# Patient Record
Sex: Male | Born: 2005 | Race: White | Hispanic: No | Marital: Single | Smoking: Never smoker
Health system: Southern US, Community
[De-identification: ages and names within clinical notes are randomized; demographics above are authoritative.]

## PROBLEM LIST (undated history)

## (undated) DIAGNOSIS — F419 Anxiety disorder, unspecified: Secondary | ICD-10-CM

## (undated) DIAGNOSIS — F329 Major depressive disorder, single episode, unspecified: Secondary | ICD-10-CM

## (undated) DIAGNOSIS — F32A Depression, unspecified: Secondary | ICD-10-CM

## (undated) HISTORY — PX: ADENOIDECTOMY: SHX5191

## (undated) HISTORY — PX: OTHER SURGICAL HISTORY: SHX169

---
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
## 2023-01-25 ENCOUNTER — Ambulatory Visit (INDEPENDENT_AMBULATORY_CARE_PROVIDER_SITE_OTHER): Admitting: Family

## 2023-01-25 ENCOUNTER — Encounter: Payer: Self-pay | Admitting: Family

## 2023-01-25 VITALS — BP 101/60 | HR 71 | Temp 97.9°F | Ht 69.0 in | Wt 115.0 lb

## 2023-01-25 DIAGNOSIS — F320 Major depressive disorder, single episode, mild: Secondary | ICD-10-CM

## 2023-01-25 DIAGNOSIS — F411 Generalized anxiety disorder: Secondary | ICD-10-CM | POA: Diagnosis not present

## 2023-01-25 MED ORDER — DESVENLAFAXINE SUCCINATE ER 100 MG PO TB24: 100.0000 mg | ORAL_TABLET | Freq: Every day | ORAL | 1 refills | Status: DC

## 2023-01-25 MED ORDER — BUSPIRONE HCL 5 MG PO TABS: 5.0000 mg | ORAL_TABLET | Freq: Three times a day (TID) | ORAL | 1 refills | Status: DC | PRN

## 2023-01-25 MED ORDER — DESVENLAFAXINE SUCCINATE ER 50 MG PO TB24: ORAL_TABLET | ORAL | 0 refills | Status: DC

## 2023-01-25 NOTE — Patient Instructions (Signed)

## 2023-01-25 NOTE — Progress Notes (Signed)
Subjective:    Patient ID: Taylor Juarez, male    DOB: 2006-04-02, 17 y.o.   MRN: [id number]  Chief Complaint  Patient presents with   Depression   PT presents to the office today to discuss depression. He reports he has not been on any medications for the last two years.  He has tried Zoloft, Lexapro, Effexor, Pristiq, and Vistaril.  Depression        This is a chronic problem.  The current episode started more than 1 year ago.   Associated symptoms include fatigue, helplessness, hopelessness, irritable, restlessness, decreased interest and sad.  Associated symptoms include no appetite change and no suicidal ideas.  Past medical history includes anxiety.   Anxiety Presents for follow-up visit. Symptoms include compulsions, excessive worry, nervous/anxious behavior, obsessions and restlessness. Patient reports no suicidal ideas. The severity of symptoms is moderate.        Review of Systems  Constitutional:  Positive for fatigue. Negative for appetite change.  Psychiatric/Behavioral:  Positive for depression. Negative for suicidal ideas. The patient is nervous/anxious.   All other systems reviewed and are negative.      Objective:   Physical Exam Vitals reviewed.  Constitutional:      General: He is irritable. He is not in acute distress.    Appearance: He is well-developed.  HENT:     Head: Normocephalic.  Eyes:     General:        Right eye: No discharge.        Left eye: No discharge.     Pupils: Pupils are equal, round, and reactive to light.  Neck:     Thyroid: No thyromegaly.  Cardiovascular:     Rate and Rhythm: Normal rate and regular rhythm.     Heart sounds: Normal heart sounds. No murmur heard. Pulmonary:     Effort: Pulmonary effort is normal. No respiratory distress.     Breath sounds: Normal breath sounds. No wheezing.  Abdominal:     General: Bowel sounds are normal. There is no distension.     Palpations: Abdomen is soft.     Tenderness: There is no  abdominal tenderness.  Musculoskeletal:        General: No tenderness. Normal range of motion.     Cervical back: Normal range of motion and neck supple.  Skin:    General: Skin is warm and dry.     Findings: No erythema or rash.  Neurological:     Mental Status: He is alert and oriented to person, place, and time.     Cranial Nerves: No cranial nerve deficit.     Deep Tendon Reflexes: Reflexes are normal and symmetric.  Psychiatric:        Mood and Affect: Affect is flat.        Behavior: Behavior normal.        Thought Content: Thought content normal.        Judgment: Judgment normal.     BP 101/60   Pulse 71   Temp 97.9 F (36.6 C) (Temporal)   Ht 5\' 9"  (1.753 m)   Wt 115 lb (52.2 kg)   SpO2 97%   BMI 16.98 kg/m       Assessment & Plan:   Taylor Juarez comes in today with chief complaint of Depression   Diagnosis and orders addressed:  1. GAD (generalized anxiety disorder) - desvenlafaxine (PRISTIQ) 50 MG 24 hr tablet; Take 1 tablet (50 mg total) by mouth daily for 14  days, THEN 2 tablets (100 mg total) daily for 14 days.  Dispense: 42 tablet; Refill: 0 - desvenlafaxine (PRISTIQ) 100 MG 24 hr tablet; Take 1 tablet (100 mg total) by mouth daily.  Dispense: 90 tablet; Refill: 1 - busPIRone (BUSPAR) 5 MG tablet; Take 1 tablet (5 mg total) by mouth 3 (three) times daily as needed.  Dispense: 90 tablet; Refill: 1  2. Depression, major, single episode, mild (HCC) - desvenlafaxine (PRISTIQ) 50 MG 24 hr tablet; Take 1 tablet (50 mg total) by mouth daily for 14 days, THEN 2 tablets (100 mg total) daily for 14 days.  Dispense: 42 tablet; Refill: 0 - desvenlafaxine (PRISTIQ) 100 MG 24 hr tablet; Take 1 tablet (100 mg total) by mouth daily.  Dispense: 90 tablet; Refill: 1 - busPIRone (BUSPAR) 5 MG tablet; Take 1 tablet (5 mg total) by mouth 3 (three) times daily as needed.  Dispense: 90 tablet; Refill: 1  Will start Pristiq 50 mg then increase 100 mg after 2 weeks  Buspar 5 mg  TID prn  Stress management  Reviewed gene sight testing  Follow up plan: 6-8 weeks   Taylor Rodney, FNP

## 2023-03-12 ENCOUNTER — Encounter: Payer: Self-pay | Admitting: Family

## 2023-03-12 ENCOUNTER — Ambulatory Visit: Admitting: Family

## 2023-04-17 ENCOUNTER — Encounter: Payer: Self-pay | Admitting: Family Medicine

## 2023-04-17 ENCOUNTER — Ambulatory Visit (INDEPENDENT_AMBULATORY_CARE_PROVIDER_SITE_OTHER): Admitting: Family Medicine

## 2023-04-17 VITALS — BP 123/64 | HR 110 | Temp 98.7°F | Ht 69.0 in | Wt 120.2 lb

## 2023-04-17 DIAGNOSIS — Z23 Encounter for immunization: Secondary | ICD-10-CM

## 2023-04-17 DIAGNOSIS — F320 Major depressive disorder, single episode, mild: Secondary | ICD-10-CM

## 2023-04-17 DIAGNOSIS — F411 Generalized anxiety disorder: Secondary | ICD-10-CM | POA: Diagnosis not present

## 2023-04-17 MED ORDER — DESVENLAFAXINE SUCCINATE ER 100 MG PO TB24: 100.0000 mg | ORAL_TABLET | Freq: Every day | ORAL | 1 refills | Status: DC

## 2023-04-17 NOTE — Progress Notes (Signed)
Subjective: CC:GAD/ depression PCP: Junie Spencer, FNP ZOX:[id number] Taylor Juarez is a 17 y.o. male presenting to clinic today for:  1. GAD/ depression Patient seen in Sept. By PCP, Jannifer Rodney and was started on Pristiq and Buspar.  He was instructed to follow up in Oct. But no showed that visit.  Today he presents for acute visit to discuss GAD.  He notes that he did not find the medication to be helpful.  For some reason the 100 mg that she had sent in was not available to him through the pharmacy.  They apparently "did not get it".  He subsequently has been out of the medication for about 3 weeks now.  Has used BuSpar occasionally but has not had to use it much.  Has not yet established care with a counselor but does have information to do so.   ROS: Per HPI  Allergies  Allergen Reactions   Sulfa Antibiotics Hives   Past Medical History:  Diagnosis Date   Anxiety    Depression     Current Outpatient Medications:    busPIRone (BUSPAR) 5 MG tablet, Take 1 tablet (5 mg total) by mouth 3 (three) times daily as needed., Disp: 90 tablet, Rfl: 1   desvenlafaxine (PRISTIQ) 100 MG 24 hr tablet, Take 1 tablet (100 mg total) by mouth daily., Disp: 90 tablet, Rfl: 1   desvenlafaxine (PRISTIQ) 50 MG 24 hr tablet, Take 1 tablet (50 mg total) by mouth daily for 14 days, THEN 2 tablets (100 mg total) daily for 14 days., Disp: 42 tablet, Rfl: 0 Social History   Socioeconomic History   Marital status: Single    Spouse name: Not on file   Number of children: Not on file   Years of education: Not on file   Highest education level: Not on file  Occupational History   Not on file  Tobacco Use   Smoking status: Never   Smokeless tobacco: Never  Vaping Use   Vaping status: Never Used  Substance and Sexual Activity   Alcohol use: Not Currently   Drug use: Never   Sexual activity: Never  Other Topics Concern   Not on file  Social History Narrative   Not on file   Social Determinants of  Health   Financial Resource Strain: Not on file  Food Insecurity: Not on file  Transportation Needs: Not on file  Physical Activity: Not on file  Stress: Not on file  Social Connections: Not on file  Intimate Partner Violence: Not on file   No family history on file.  Objective: Office vital signs reviewed. BP 123/64   Pulse (!) 110   Temp 98.7 F (37.1 C)   Ht 5\' 9"  (1.753 m)   Wt 120 lb 3.2 oz (54.5 kg)   SpO2 97%   BMI 17.75 kg/m   Physical Examination:  General: Awake, alert, thin, No acute distress HEENT: Sclera white.  Moist mucous membranes Cardio: regular rate and rhythm, S1S2 heard, no murmurs appreciated Pulm: clear to auscultation bilaterally, no wheezes, rhonchi or rales; normal work of breathing on room air Psych: Affect flat.  Mood stable.  Pleasant.     04/17/2023   10:19 AM 01/25/2023    2:49 PM 12/20/2020    2:08 PM  Depression screen PHQ 2/9  Decreased Interest 2 3 1   Down, Depressed, Hopeless 1 2 1   PHQ - 2 Score 3 5 2   Altered sleeping 3 2 1   Tired, decreased energy 3 3 0  Change in appetite 2 3 0  Feeling bad or failure about yourself  1 1 0  Trouble concentrating 2 1 1   Moving slowly or fidgety/restless 0 0 0  Suicidal thoughts 0 0 0  PHQ-9 Score 14 15 4   Difficult doing work/chores  Somewhat difficult Not difficult at all      04/17/2023   10:19 AM 01/25/2023    2:49 PM 12/20/2020    2:08 PM 10/20/2020    3:31 PM  GAD 7 : Generalized Anxiety Score  Nervous, Anxious, on Edge 2 2 0 2  Control/stop worrying 2 1 0 2  Worry too much - different things 1 3 0 1  Trouble relaxing 3 3 0 3  Restless 2 2 0 1  Easily annoyed or irritable 1 1 0 2  Afraid - awful might happen 3 2 1 1   Total GAD 7 Score 14 14 1 12   Anxiety Difficulty  Somewhat difficult Not difficult at all Somewhat difficult    Assessment/ Plan: 17 y.o. male   GAD (generalized anxiety disorder) - Plan: desvenlafaxine (PRISTIQ) 100 MG 24 hr tablet  Depression, major, single  episode, mild (HCC) - Plan: desvenlafaxine (PRISTIQ) 100 MG 24 hr tablet  Encounter for immunization - Plan: Flu vaccine trivalent PF, 6mos and older(Flulaval,Afluria,Fluarix,Fluzone)  Anxiety and depression are not currently controlled but he has been out of the medication for a month now.  I have resent the prescription that his PCP sent off initially.  Encouraged to follow-up with both counseling services and PCP as directed.  Influenza vaccination administered.   Raliegh Ip, DO [facility] [phone]

## 2024-05-28 ENCOUNTER — Ambulatory Visit: Payer: Self-pay

## 2024-05-28 NOTE — Telephone Encounter (Signed)
 Apt scheduled.

## 2024-05-28 NOTE — Telephone Encounter (Signed)
 FYI Only or Action Required?: FYI only for provider: appointment scheduled on 1/12.  Patient was last seen in primary care on 04/17/2023 by Jolinda Norene HERO, DO.  Called Nurse Triage reporting Depression.  Symptoms began several weeks ago.  Interventions attempted: Nothing.  Symptoms are: gradually worsening.  Triage Disposition: See Physician Within 24 Hours  Patient/caregiver understands and will follow disposition?: Yes, will follow disposition  Copied from CRM #[id number]. Topic: Clinical - Red Word Triage >> May 28, 2024 11:04 AM Avram MATSU wrote: Red Word that prompted transfer to Nurse Triage: anxiety/depression getting worse   ----------------------------------------------------------------------- From previous Reason for Contact - Scheduling: Patient/patient representative is calling to schedule an appointment. Refer to attachments for appointment information. Reason for Disposition  [1] Depression AND [2] getting worse (e.g., sleeping poorly, less able to do activities of daily living)  Answer Assessment - Initial Assessment Questions 1. CONCERN: What happened that made you call today?     My anxiety and depression is getting worse, would like to talk to her about my old prescription 2. DEPRESSION SYMPTOM SCREENING: How are you feeling overall? (e.g., decreased energy, increased sleeping or difficulty sleeping, difficulty concentrating, feelings of sadness, guilt, hopelessness, or worthlessness)     Difficult memory, energy inconsistent, restless sleep 3. RISK OF HARM - SUICIDAL IDEATION:  Do you ever have thoughts of hurting or killing yourself?  (e.g., yes, no, no but preoccupation with thoughts about death)     denies 4. RISK OF HARM - HOMICIDAL IDEATION:  Do you ever have thoughts of hurting or killing someone else?  (e.g., yes, no, no but preoccupation with thoughts about death)     denies 5. FUNCTIONAL IMPAIRMENT: How have things been going for you  overall? Have you had more difficulty than usual doing your normal daily activities?  (e.g., better, same, worse; self-care, school, work, interactions)     same 6. SUPPORT: Who is with you now? Who do you live with? Do you have family or friends who you can talk to?      States has support system 7. THERAPIST: Do you have a counselor or therapist? If Yes, ask: What is their name?     denies 8. STRESSORS: Has there been any new stress or recent changes in your life?     Yes, 9. ALCOHOL USE OR SUBSTANCE USE (DRUG USE): Do you drink alcohol or use any illegal drugs?     ETOH on occasion 10. OTHER: Do you have any other physical symptoms right now? (e.g., fever)       denies  Protocols used: Depression-A-AH

## 2024-06-01 ENCOUNTER — Ambulatory Visit: Admitting: Family

## 2024-06-01 ENCOUNTER — Encounter: Payer: Self-pay | Admitting: Family

## 2024-06-01 VITALS — BP 104/70 | HR 85 | Temp 97.8°F | Ht 69.0 in | Wt 128.2 lb

## 2024-06-01 DIAGNOSIS — F332 Major depressive disorder, recurrent severe without psychotic features: Secondary | ICD-10-CM | POA: Diagnosis not present

## 2024-06-01 DIAGNOSIS — F411 Generalized anxiety disorder: Secondary | ICD-10-CM | POA: Diagnosis not present

## 2024-06-01 MED ORDER — DESVENLAFAXINE SUCCINATE ER 50 MG PO TB24: ORAL_TABLET | ORAL | 0 refills | Status: AC

## 2024-06-01 MED ORDER — DESVENLAFAXINE SUCCINATE ER 100 MG PO TB24: 100.0000 mg | ORAL_TABLET | Freq: Every day | ORAL | 1 refills | Status: AC

## 2024-06-01 MED ORDER — BUSPIRONE HCL 5 MG PO TABS: 5.0000 mg | ORAL_TABLET | Freq: Three times a day (TID) | ORAL | 1 refills | Status: AC | PRN

## 2024-06-01 NOTE — Progress Notes (Signed)
 "  Subjective:    Patient ID: Taylor Juarez, male    DOB: 12/28/2005, 19 y.o.   MRN: [id number]  Chief Complaint  Patient presents with   Depression    States he does have recent changes in his life that have made things more difficult. Noticed a change late October spiked 3 days ago   PT presents to the office today with increase depression. He reports he stopped his Pristiq  100 mg over 2 months ago. This seemed to be helping when he could remember to take it.   Depression        This is a recurrent problem.  The current episode started more than 1 year ago.   The problem occurs intermittently.  Associated symptoms include helplessness, hopelessness, insomnia, restlessness, decreased interest, appetite change and sad.  Associated symptoms include not irritable and no suicidal ideas.     The symptoms are aggravated by nothing.  Past medical history includes anxiety.   Anxiety Presents for follow-up visit. Symptoms include depressed mood, excessive worry, insomnia, nervous/anxious behavior and restlessness. Patient reports no suicidal ideas. Symptoms occur constantly. The severity of symptoms is moderate.        Review of Systems  Constitutional:  Positive for appetite change.  Psychiatric/Behavioral:  Positive for depression. Negative for suicidal ideas. The patient is nervous/anxious and has insomnia.   All other systems reviewed and are negative.   Social History   Socioeconomic History   Marital status: Single    Spouse name: Not on file   Number of children: Not on file   Years of education: Not on file   Highest education level: Not on file  Occupational History   Not on file  Tobacco Use   Smoking status: Never   Smokeless tobacco: Never  Vaping Use   Vaping status: Never Used  Substance and Sexual Activity   Alcohol use: Not Currently   Drug use: Never   Sexual activity: Never  Other Topics Concern   Not on file  Social History Narrative   Not on file    Social Drivers of Health   Tobacco Use: Low Risk (06/01/2024)   Patient History    Smoking Tobacco Use: Never    Smokeless Tobacco Use: Never    Passive Exposure: Not on file  Financial Resource Strain: Not on file  Food Insecurity: Not on file  Transportation Needs: Not on file  Physical Activity: Not on file  Stress: Not on file  Social Connections: Not on file  Depression (PHQ2-9): High Risk (06/01/2024)   Depression (PHQ2-9)    PHQ-2 Score: 15  Alcohol Screen: Not on file  Housing: Not on file  Utilities: Not on file  Health Literacy: Not on file   History reviewed. No pertinent family history.      Objective:   Physical Exam Vitals reviewed.  Constitutional:      General: He is not irritable.He is not in acute distress.    Appearance: He is well-developed.  HENT:     Head: Normocephalic.  Eyes:     General:        Right eye: No discharge.        Left eye: No discharge.     Pupils: Pupils are equal, round, and reactive to light.  Neck:     Thyroid: No thyromegaly.  Cardiovascular:     Rate and Rhythm: Normal rate and regular rhythm.     Heart sounds: Normal heart sounds. No murmur heard. Pulmonary:  Effort: Pulmonary effort is normal. No respiratory distress.     Breath sounds: Normal breath sounds. No wheezing.  Abdominal:     General: Bowel sounds are normal. There is no distension.     Palpations: Abdomen is soft.     Tenderness: There is no abdominal tenderness.  Musculoskeletal:        General: No tenderness. Normal range of motion.     Cervical back: Normal range of motion and neck supple.  Skin:    General: Skin is warm and dry.     Findings: No erythema or rash.  Neurological:     Mental Status: He is alert and oriented to person, place, and time.     Cranial Nerves: No cranial nerve deficit.     Deep Tendon Reflexes: Reflexes are normal and symmetric.  Psychiatric:        Behavior: Behavior normal.        Thought Content: Thought content  normal.        Judgment: Judgment normal.       BP 104/70   Pulse 85   Temp 97.8 F (36.6 C) (Temporal)   Ht 5' 9 (1.753 m)   Wt 128 lb 3.2 oz (58.2 kg)   SpO2 98%   BMI 18.93 kg/m      Assessment & Plan:  Taylor Juarez comes in today with chief complaint of Depression (States he does have recent changes in his life that have made things more difficult. Noticed a change late October spiked 3 days ago)   Diagnosis and orders addressed:  1. GAD (generalized anxiety disorder) (Primary) - desvenlafaxine  (PRISTIQ ) 50 MG 24 hr tablet; Take 1 tablet (50 mg total) by mouth daily for 14 days, THEN 2 tablets (100 mg total) daily for 14 days.  Dispense: 42 tablet; Refill: 0 - desvenlafaxine  (PRISTIQ ) 100 MG 24 hr tablet; Take 1 tablet (100 mg total) by mouth daily.  Dispense: 90 tablet; Refill: 1 - busPIRone  (BUSPAR ) 5 MG tablet; Take 1 tablet (5 mg total) by mouth 3 (three) times daily as needed.  Dispense: 90 tablet; Refill: 1  2. Severe episode of recurrent major depressive disorder, without psychotic features (HCC) - desvenlafaxine  (PRISTIQ ) 50 MG 24 hr tablet; Take 1 tablet (50 mg total) by mouth daily for 14 days, THEN 2 tablets (100 mg total) daily for 14 days.  Dispense: 42 tablet; Refill: 0 - desvenlafaxine  (PRISTIQ ) 100 MG 24 hr tablet; Take 1 tablet (100 mg total) by mouth daily.  Dispense: 90 tablet; Refill: 1 - busPIRone  (BUSPAR ) 5 MG tablet; Take 1 tablet (5 mg total) by mouth 3 (three) times daily as needed.  Dispense: 90 tablet; Refill: 1  Restart Pristiq  50 mg daily for 2 weeks then increase to 100 mg  Stress management  Buspar  5 mg TID prn  Does not want referral for therapy at this time related to cost Follow up in 6 weeks     Bari Learn, FNP   "

## 2024-06-01 NOTE — Patient Instructions (Signed)

## 2024-06-23 ENCOUNTER — Other Ambulatory Visit: Payer: Self-pay | Admitting: Family

## 2024-06-23 DIAGNOSIS — F411 Generalized anxiety disorder: Secondary | ICD-10-CM

## 2024-06-23 DIAGNOSIS — F332 Major depressive disorder, recurrent severe without psychotic features: Secondary | ICD-10-CM

## 2024-07-13 ENCOUNTER — Ambulatory Visit: Admitting: Family
